# Patient Record
Sex: Male | Born: 1975 | Race: Black or African American | Hispanic: No | Marital: Single | Smoking: Current every day smoker
Health system: Southern US, Community
[De-identification: ages and names within clinical notes are randomized; demographics above are authoritative.]

## PROBLEM LIST (undated history)

## (undated) DIAGNOSIS — K219 Gastro-esophageal reflux disease without esophagitis: Secondary | ICD-10-CM

## (undated) DIAGNOSIS — I10 Essential (primary) hypertension: Secondary | ICD-10-CM

## (undated) HISTORY — DX: Gastro-esophageal reflux disease without esophagitis: K21.9

## (undated) HISTORY — DX: Essential (primary) hypertension: I10

## (undated) HISTORY — PX: NO PAST SURGERIES: SHX2092

---

## 2005-04-16 ENCOUNTER — Emergency Department: Payer: Self-pay | Admitting: Emergency Medicine

## 2006-12-29 ENCOUNTER — Ambulatory Visit: Payer: Self-pay | Admitting: Internal Medicine

## 2007-06-04 ENCOUNTER — Emergency Department: Payer: Self-pay | Admitting: Emergency Medicine
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
## 2012-03-05 ENCOUNTER — Ambulatory Visit: Payer: Self-pay | Admitting: Emergency Medicine

## 2012-03-05 LAB — URINALYSIS, COMPLETE
Glucose,UR: NEGATIVE mg/dL (ref 0–75)
Ketone: NEGATIVE
Ph: 7.5 (ref 4.5–8.0)
Protein: NEGATIVE
Squamous Epithelial: NONE SEEN

## 2012-03-05 LAB — OCCULT BLOOD X 1 CARD TO LAB, STOOL: Occult Blood, Feces: NEGATIVE

## 2012-03-14 ENCOUNTER — Ambulatory Visit: Payer: Self-pay

## 2012-03-14 LAB — URINALYSIS, COMPLETE
Bilirubin,UR: NEGATIVE
Nitrite: NEGATIVE
Ph: 5 (ref 4.5–8.0)

## 2012-10-09 ENCOUNTER — Ambulatory Visit: Payer: Self-pay | Admitting: Family Medicine

## 2012-10-09 LAB — BASIC METABOLIC PANEL
Anion Gap: 9 (ref 7–16)
BUN: 14 mg/dL (ref 7–18)
Calcium, Total: 9.6 mg/dL (ref 8.5–10.1)
Co2: 30 mmol/L (ref 21–32)
Creatinine: 1.42 mg/dL — ABNORMAL HIGH (ref 0.60–1.30)
EGFR (African American): >60
EGFR (Non-African Amer.): >60
Glucose: 95 mg/dL (ref 65–99)
Potassium: 4.6 mmol/L (ref 3.5–5.1)

## 2013-06-30 ENCOUNTER — Ambulatory Visit: Payer: Self-pay | Admitting: Physician Assistant

## 2013-06-30 LAB — URINALYSIS, COMPLETE
BACTERIA: NEGATIVE
Bilirubin,UR: NEGATIVE
Glucose,UR: NEGATIVE mg/dL (ref 0–75)
Ketone: NEGATIVE
LEUKOCYTE ESTERASE: NEGATIVE
Nitrite: NEGATIVE
Ph: 7 (ref 4.5–8.0)
SPECIFIC GRAVITY: 1.015 (ref 1.003–1.030)
SQUAMOUS EPITHELIAL: NONE SEEN

## 2013-07-02 LAB — URINE CULTURE

## 2013-07-13 ENCOUNTER — Ambulatory Visit: Payer: Self-pay

## 2013-07-13 LAB — CBC WITH DIFFERENTIAL/PLATELET
Basophil #: 0 10*3/uL (ref 0.0–0.1)
Basophil %: 0.5 %
EOS ABS: 0.1 10*3/uL (ref 0.0–0.7)
EOS PCT: 1.3 %
HCT: 50.7 % (ref 40.0–52.0)
HGB: 17.1 g/dL (ref 13.0–18.0)
Lymphocyte #: 2.3 10*3/uL (ref 1.0–3.6)
Lymphocyte %: 38.9 %
MCH: 32 pg (ref 26.0–34.0)
MCHC: 33.8 g/dL (ref 32.0–36.0)
MCV: 95 fL (ref 80–100)
Monocyte #: 0.4 x10 3/mm (ref 0.2–1.0)
Monocyte %: 7.7 %
NEUTROS PCT: 51.6 %
Neutrophil #: 3 10*3/uL (ref 1.4–6.5)
PLATELETS: 247 10*3/uL (ref 150–440)
RBC: 5.36 10*6/uL (ref 4.40–5.90)
RDW: 13 % (ref 11.5–14.5)
WBC: 5.8 10*3/uL (ref 3.8–10.6)

## 2013-07-13 LAB — COMPREHENSIVE METABOLIC PANEL
Albumin: 4.8 g/dL (ref 3.4–5.0)
Alkaline Phosphatase: 110 U/L
Anion Gap: 15 (ref 7–16)
BUN: 16 mg/dL (ref 7–18)
Bilirubin,Total: 1.1 mg/dL — ABNORMAL HIGH (ref 0.2–1.0)
CO2: 23 mmol/L (ref 21–32)
Calcium, Total: 9.9 mg/dL (ref 8.5–10.1)
Chloride: 99 mmol/L (ref 98–107)
Creatinine: 1.42 mg/dL — ABNORMAL HIGH (ref 0.60–1.30)
EGFR (African American): >60
EGFR (Non-African Amer.): >60
GLUCOSE: 98 mg/dL (ref 65–99)
Osmolality: 275 (ref 275–301)
Potassium: 4.3 mmol/L (ref 3.5–5.1)
SGOT(AST): 25 U/L (ref 15–37)
SGPT (ALT): 62 U/L (ref 12–78)
SODIUM: 137 mmol/L (ref 136–145)
TOTAL PROTEIN: 9 g/dL — AB (ref 6.4–8.2)

## 2013-07-13 LAB — URINALYSIS, COMPLETE
BILIRUBIN, UR: NEGATIVE
Bacteria: NEGATIVE
GLUCOSE, UR: NEGATIVE mg/dL (ref 0–75)
LEUKOCYTE ESTERASE: NEGATIVE
Nitrite: NEGATIVE
Ph: 6 (ref 4.5–8.0)
Protein: NEGATIVE
SPECIFIC GRAVITY: 1.01 (ref 1.003–1.030)
SQUAMOUS EPITHELIAL: NONE SEEN
WBC UR: NONE SEEN /HPF (ref 0–5)

## 2013-07-13 LAB — CK TOTAL AND CKMB (NOT AT ARMC)
CK, Total: 150 U/L
CK-MB: 0.8 ng/mL (ref 0.5–3.6)

## 2013-07-13 LAB — D-DIMER(ARMC): D-Dimer: 232 ng/ml

## 2014-06-09 ENCOUNTER — Encounter: Payer: Self-pay | Admitting: Internal Medicine

## 2014-06-09 DIAGNOSIS — F419 Anxiety disorder, unspecified: Secondary | ICD-10-CM | POA: Insufficient documentation

## 2014-06-09 DIAGNOSIS — E782 Mixed hyperlipidemia: Secondary | ICD-10-CM | POA: Insufficient documentation

## 2014-06-09 DIAGNOSIS — K219 Gastro-esophageal reflux disease without esophagitis: Secondary | ICD-10-CM | POA: Insufficient documentation

## 2014-06-09 DIAGNOSIS — I10 Essential (primary) hypertension: Secondary | ICD-10-CM | POA: Insufficient documentation

## 2014-07-30 ENCOUNTER — Other Ambulatory Visit: Payer: Self-pay | Admitting: Internal Medicine

## 2014-08-26 ENCOUNTER — Ambulatory Visit (INDEPENDENT_AMBULATORY_CARE_PROVIDER_SITE_OTHER): Payer: 59 | Admitting: Internal Medicine

## 2014-08-26 ENCOUNTER — Other Ambulatory Visit: Payer: Self-pay | Admitting: Internal Medicine

## 2014-08-26 ENCOUNTER — Encounter: Payer: Self-pay | Admitting: Internal Medicine

## 2014-08-26 VITALS — BP 136/90 | HR 68 | Ht 73.0 in | Wt 236.6 lb

## 2014-08-26 DIAGNOSIS — I10 Essential (primary) hypertension: Secondary | ICD-10-CM

## 2014-08-26 DIAGNOSIS — K2100 Gastro-esophageal reflux disease with esophagitis, without bleeding: Secondary | ICD-10-CM

## 2014-08-26 DIAGNOSIS — R361 Hematospermia: Secondary | ICD-10-CM | POA: Diagnosis not present

## 2014-08-26 MED ORDER — DOXYCYCLINE HYCLATE 100 MG PO TABS: 100.0000 mg | ORAL_TABLET | Freq: Two times a day (BID) | ORAL | Status: DC

## 2014-08-26 NOTE — Progress Notes (Signed)
Date:  08/26/2014   Name:  Roger Lara   DOB:  [birth year]   MRN:  [id number]   Chief Complaint: Karie Fetch Reflux Gastrophageal Reflux He complains of abdominal pain and heartburn. He reports no chest pain, no choking, no coughing, no dysphagia, no sore throat or no wheezing. The problem occurs frequently. The problem has been unchanged. The heartburn is located in the substernum. The heartburn does not wake him from sleep. The symptoms are aggravated by certain foods, stress, ETOH and smoking (his girlfriend recently had a second tubal pregnancy so can no longer conceive.). Pertinent negatives include no anemia, melena or weight loss. He has tried a PPI (taking omeprazole daily) for the symptoms. The treatment provided moderate relief.  Hypertension This is a chronic problem. The problem has been gradually improving since onset. The problem is controlled (he had cut back the dose due to some ED but resumed 20 mg when he developed mild headache). Associated symptoms include headaches. Pertinent negatives include no chest pain, peripheral edema or shortness of breath. There are no associated agents to hypertension. Past treatments include ACE inhibitors. The current treatment provides significant improvement. There are no compliance problems.    Blood in ejaculate Patient has noticed blood in his ejaculate on several occasions - interspersed with no blood.  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  He denies pain with intercourse or bowel movement.  No fever, dysuria or pelvic pain.  No penile discharge.  Review of Systems:  Review of Systems  Constitutional: Negative for fever, weight loss and unexpected weight change.  HENT: Negative for sinus pressure and sore throat.   Respiratory: Negative for cough, choking, shortness of breath and wheezing.   Cardiovascular: Negative for chest pain and leg  swelling.  Gastrointestinal: Positive for heartburn and abdominal pain. Negative for dysphagia, constipation, blood in stool and melena.  Genitourinary: Negative for hematuria.       Blood in ejaculate  Musculoskeletal: Negative for back pain and gait problem.  Neurological: Positive for headaches.    Patient Active Problem List   Diagnosis Date Noted  . Anxiety 06/09/2014  . Essential (primary) hypertension 06/09/2014  . Acid reflux 06/09/2014  . Mixed hyperlipidemia 06/09/2014    Prior to Admission medications   Medication Sig Start Date End Date Taking? Authorizing Provider  lisinopril (PRINIVIL,ZESTRIL) 20 MG tablet Take 1 tablet by mouth daily. 08/23/14  Yes Historical Provider, MD  omeprazole (PRILOSEC) 40 MG capsule TAKE ONE CAPSULE BY MOUTH ONCE DAILY 07/30/14  Yes Reubin Milan, MD    Allergies  Allergen Reactions  . Serotonin Reuptake Inhibitors (Ssris)     Other reaction(s): drowsiness dry mouth, anorexia  . Sulfa Antibiotics     muscle pain    No past surgical history on file.  History  Substance Use Topics  . Smoking status: Current Some Day Smoker  . Smokeless tobacco: Not on file     Comment: patient not ready to quit  . Alcohol Use: 0.0 oz/week    0 Standard drinks or equivalent per week     Medication list has been reviewed and updated.  Physical Examination:  Physical Exam  Constitutional: He is oriented to person, place, and time. He appears well-developed and well-nourished. No distress.  Neck: Normal range of motion. Neck supple. No thyromegaly present.  Cardiovascular: Normal rate, regular rhythm and normal heart sounds.   Pulmonary/Chest: Effort  normal and breath sounds normal. No respiratory distress. He has no wheezes.  Abdominal: Soft. Bowel sounds are normal. He exhibits no distension and no mass. There is no tenderness. There is no guarding.  Musculoskeletal: Normal range of motion. He exhibits no edema.  Lymphadenopathy:    He has no  cervical adenopathy.  Neurological: He is alert and oriented to person, place, and time.  Psychiatric: He has a normal mood and affect. His speech is normal. Cognition and memory are normal.  Sad and tearful relating recent miscarriage and news that his girl friend can not have children.    BP 148/98 mmHg  Pulse 68  Ht 6\' 1"  (1.854 m)  Wt 236 lb 9.6 oz (107.321 kg)  BMI 31.22 kg/m2  Assessment and Plan: 1. Gastroesophageal reflux disease with esophagitis Continue omeprazole; symptoms worsened by recent stressors Reduce alcohol intake and fast food Return if symptoms fail to improve  2. Essential (primary) hypertension Fair control - continue current therapy of lisinopril 20 mg  3. Hematospermia Suspect Prostatitis; recommend seat cushion to reduce pressure Follow up or call for referral if persistent - doxycycline (VIBRA-TABS) 100 MG tablet; Take 1 tablet (100 mg total) by mouth 2 (two) times daily.  Dispense: 20 tablet; Refill: 0   Bari EdwardLaura Berglund, MD [facility]  08/26/2014

## 2014-09-02 ENCOUNTER — Other Ambulatory Visit: Payer: Self-pay | Admitting: Internal Medicine

## 2014-12-17 ENCOUNTER — Other Ambulatory Visit: Payer: Self-pay | Admitting: Internal Medicine

## 2015-03-24 ENCOUNTER — Other Ambulatory Visit: Payer: Self-pay | Admitting: Internal Medicine

## 2015-03-29 ENCOUNTER — Ambulatory Visit (INDEPENDENT_AMBULATORY_CARE_PROVIDER_SITE_OTHER): Payer: 59 | Admitting: Internal Medicine

## 2015-03-29 ENCOUNTER — Encounter: Payer: Self-pay | Admitting: Internal Medicine

## 2015-03-29 VITALS — BP 128/84 | HR 88 | Ht 73.0 in | Wt 260.0 lb

## 2015-03-29 DIAGNOSIS — N342 Other urethritis: Secondary | ICD-10-CM

## 2015-03-29 MED ORDER — FLUCONAZOLE 100 MG PO TABS: 100.0000 mg | ORAL_TABLET | Freq: Every day | ORAL | Status: DC

## 2015-03-29 NOTE — Progress Notes (Signed)
    Date:  03/29/2015   Name:  Roger Lara   DOB:  1975-11-21   MRN:  [id number]   Chief Complaint: Penile Discharge  Patient complains of about 1 week of urethral itching and mild discomfort with urination. He denies any discharge or exposure to STDs. He's had similar symptoms in the past after antibiotics and was treated for a yeast infection. He had a full course of antibiotics several weeks ago for dental infection and thinks this is related. Denies fever or chills, blood in his urine, low back pain, or lymphadenopathy.   Review of Systems  Constitutional: Negative for fever, chills and fatigue.  Genitourinary: Positive for dysuria. Negative for frequency, hematuria, discharge and genital sores.    Patient Active Problem List   Diagnosis Date Noted  . Anxiety 06/09/2014  . Essential (primary) hypertension 06/09/2014  . Acid reflux 06/09/2014  . Mixed hyperlipidemia 06/09/2014    Prior to Admission medications   Medication Sig Start Date End Date Taking? Authorizing Provider  lisinopril (PRINIVIL,ZESTRIL) 20 MG tablet TAKE ONE TABLET BY MOUTH ONCE DAILY 12/17/14  Yes Reubin Milan, MD  omeprazole (PRILOSEC) 40 MG capsule TAKE ONE CAPSULE BY MOUTH ONCE DAILY 03/24/15  Yes Reubin Milan, MD    Allergies  Allergen Reactions  . Serotonin Reuptake Inhibitors (Ssris)     Other reaction(s): drowsiness dry mouth, anorexia  . Sulfa Antibiotics     muscle pain    Past Surgical History  Procedure Laterality Date  . No past surgeries      Social History  Substance Use Topics  . Smoking status: Current Every Day Smoker -- 0.15 packs/day    Types: Cigarettes  . Smokeless tobacco: None     Comment: patient not ready to quit  . Alcohol Use: Yes     Medication list has been reviewed and updated.   Physical Exam  Constitutional: He appears well-developed and well-nourished.  Abdominal: Soft. Bowel sounds are normal.  Genitourinary: Testes normal and penis normal.  Circumcised. No penile erythema or penile tenderness. No discharge found.  Lymphadenopathy:       Right: No inguinal adenopathy present.       Left: No inguinal adenopathy present.    BP 128/84 mmHg  Pulse 88  Ht  (1.854 m)  Wt 260 lb (117.935 kg)  BMI 34.31 kg/m2  Assessment and Plan: 1. Urethritis Will treat for presume yeast - fluconazole (DIFLUCAN) 100 MG tablet; Take 1 tablet (100 mg total) by mouth daily.  Dispense: 3 tablet; Refill: 0   Bari Edward, MD [facility]  03/29/2015

## 2015-04-06 ENCOUNTER — Encounter: Payer: 59 | Admitting: Internal Medicine

## 2015-04-15 ENCOUNTER — Other Ambulatory Visit: Payer: Self-pay | Admitting: Internal Medicine

## 2015-05-19 ENCOUNTER — Other Ambulatory Visit: Payer: Self-pay | Admitting: Internal Medicine

## 2015-06-10 NOTE — Telephone Encounter (Signed)
Pt scheduled July 27 @ 830 for cpe

## 2015-07-05 ENCOUNTER — Other Ambulatory Visit: Payer: Self-pay | Admitting: Internal Medicine

## 2015-09-16 ENCOUNTER — Other Ambulatory Visit: Payer: Self-pay | Admitting: Internal Medicine

## 2015-09-16 ENCOUNTER — Encounter: Payer: Self-pay | Admitting: Internal Medicine

## 2015-09-16 ENCOUNTER — Encounter: Payer: 59 | Admitting: Internal Medicine

## 2015-09-21 ENCOUNTER — Encounter: Payer: Self-pay | Admitting: Family Medicine

## 2015-09-21 ENCOUNTER — Ambulatory Visit (INDEPENDENT_AMBULATORY_CARE_PROVIDER_SITE_OTHER): Payer: 59 | Admitting: Family Medicine

## 2015-09-21 VITALS — BP 130/70 | HR 100 | Temp 97.9°F | Ht 73.0 in | Wt 261.0 lb

## 2015-09-21 DIAGNOSIS — N410 Acute prostatitis: Secondary | ICD-10-CM | POA: Diagnosis not present

## 2015-09-21 MED ORDER — DOXYCYCLINE HYCLATE 100 MG PO TABS: 100.0000 mg | ORAL_TABLET | Freq: Two times a day (BID) | ORAL | 1 refills | Status: DC

## 2015-09-21 NOTE — Progress Notes (Signed)
Name: Roger Lara   MRN: [id number]    DOB: May 31, 1975   Date:09/21/2015       Progress Note  Subjective  Chief Complaint  Chief Complaint  Patient presents with  . Urinary Tract Infection    has noticed some blood after sex- no problems with urination- was treated for urethritis back in Feb.    Urinary Tract Infection   This is a recurrent (hemospermia) problem. The current episode started in the past 7 days. The problem occurs intermittently. The problem has been gradually improving. The quality of the pain is described as aching. The pain is at a severity of 4/10. The pain is mild. There has been no fever. Pertinent negatives include no chills, discharge, flank pain, frequency, hematuria, hesitancy, nausea, sweats, urgency or vomiting. He has tried acetaminophen for the symptoms. The treatment provided mild relief.    No problem-specific Assessment & Plan notes found for this encounter.   Past Medical History:  Diagnosis Date  . GERD (gastroesophageal reflux disease)   . Hypertension     Past Surgical History:  Procedure Laterality Date  . NO PAST SURGERIES      Family History  Problem Relation Age of Onset  . Aneurysm Father     2009, Brain  . Hypertension Mother   . Depression Mother     Social History   Social History  . Marital status: Single    Spouse name: N/A  . Number of children: N/A  . Years of education: N/A   Occupational History  . Not on file.   Social History Main Topics  . Smoking status: Current Every Day Smoker    Packs/day: 0.15    Types: Cigarettes  . Smokeless tobacco: Never Used     Comment: patient not ready to quit  . Alcohol use Yes  . Drug use:      Comment: marijuana occasional   . Sexual activity: Not on file   Other Topics Concern  . Not on file   Social History Narrative  . No narrative on file    Allergies  Allergen Reactions  . Serotonin Reuptake Inhibitors (Ssris)     Other reaction(s): drowsiness dry mouth,  anorexia  . Sulfa Antibiotics     muscle pain     Review of Systems  Constitutional: Negative for chills, fever, malaise/fatigue and weight loss.  HENT: Negative for ear discharge, ear pain and sore throat.   Eyes: Negative for blurred vision.  Respiratory: Negative for cough, sputum production, shortness of breath and wheezing.   Cardiovascular: Negative for chest pain, palpitations and leg swelling.  Gastrointestinal: Negative for abdominal pain, blood in stool, constipation, diarrhea, heartburn, melena, nausea and vomiting.  Genitourinary: Negative for dysuria, flank pain, frequency, hematuria, hesitancy and urgency.  Musculoskeletal: Negative for back pain, joint pain, myalgias and neck pain.  Skin: Negative for rash.  Neurological: Negative for dizziness, tingling, sensory change, focal weakness and headaches.  Endo/Heme/Allergies: Negative for environmental allergies and polydipsia. Does not bruise/bleed easily.  Psychiatric/Behavioral: Negative for depression and suicidal ideas. The patient is not nervous/anxious and does not have insomnia.      Objective  Vitals:   09/21/15 1347  BP: 130/70  Pulse: 100  Temp: 97.9 F (36.6 C)  TempSrc: Oral  Weight: 261 lb (118.4 kg)  Height: 6\' 1"  (1.854 m)    Physical Exam  Constitutional: He is oriented to person, place, and time and well-developed, well-nourished, and in no distress.  HENT:  Head: Normocephalic.  Right Ear: External ear normal.  Left Ear: External ear normal.  Nose: Nose normal.  Mouth/Throat: Oropharynx is clear and moist.  Eyes: Conjunctivae and EOM are normal. Pupils are equal, round, and reactive to light. Right eye exhibits no discharge. Left eye exhibits no discharge. No scleral icterus.  Neck: Normal range of motion. Neck supple. No JVD present. No tracheal deviation present. No thyromegaly present.  Cardiovascular: Normal rate, regular rhythm, normal heart sounds and intact distal pulses.  Exam reveals  no gallop and no friction rub.   No murmur heard. Pulmonary/Chest: Breath sounds normal. No respiratory distress. He has no wheezes. He has no rales.  Abdominal: Soft. Bowel sounds are normal. He exhibits no mass. There is no hepatosplenomegaly. There is no tenderness. There is no rebound, no guarding and no CVA tenderness.  Genitourinary: Rectum normal. Prostate is enlarged and tender. No discharge found.  Musculoskeletal: Normal range of motion. He exhibits no edema or tenderness.  Lymphadenopathy:    He has no cervical adenopathy.  Neurological: He is alert and oriented to person, place, and time. He has normal sensation, normal strength, normal reflexes and intact cranial nerves. No cranial nerve deficit.  Skin: Skin is warm. No rash noted.  Psychiatric: Mood and affect normal.      Assessment & Plan  Problem List Items Addressed This Visit    None    Visit Diagnoses    Acute prostatitis    -  Primary   Relevant Medications   doxycycline (VIBRA-TABS) 100 MG tablet    pt couldn't void- no urine sample collected    Dr. Hayden [facility]  09/21/15

## 2015-10-13 ENCOUNTER — Other Ambulatory Visit: Payer: Self-pay | Admitting: Internal Medicine

## 2015-10-16 ENCOUNTER — Other Ambulatory Visit: Payer: Self-pay | Admitting: Internal Medicine

## 2015-10-19 NOTE — Telephone Encounter (Signed)
pts coming in on 9/1 °

## 2015-10-22 ENCOUNTER — Ambulatory Visit: Payer: 59 | Admitting: Internal Medicine

## 2015-11-26 ENCOUNTER — Other Ambulatory Visit: Payer: Self-pay | Admitting: Internal Medicine

## 2016-05-03 ENCOUNTER — Other Ambulatory Visit: Payer: Self-pay

## 2018-04-11 ENCOUNTER — Other Ambulatory Visit: Payer: Self-pay | Admitting: Physician Assistant

## 2018-04-20 ENCOUNTER — Other Ambulatory Visit: Payer: Self-pay | Admitting: Physician Assistant

## 2020-03-10 ENCOUNTER — Ambulatory Visit
Admission: RE | Admit: 2020-03-10 | Discharge: 2020-03-10 | Disposition: A | Discharge status: Home / Self Care | Payer: 59 | Attending: Family Medicine | Admitting: Family Medicine

## 2020-03-10 ENCOUNTER — Ambulatory Visit
Admission: RE | Admit: 2020-03-10 | Discharge: 2020-03-10 | Disposition: A | Discharge status: Home / Self Care | Payer: 59 | Source: Ambulatory Visit | Attending: Family Medicine | Admitting: Family Medicine

## 2020-03-10 ENCOUNTER — Other Ambulatory Visit: Payer: Self-pay | Admitting: Family Medicine

## 2020-03-10 ENCOUNTER — Other Ambulatory Visit: Payer: Self-pay

## 2020-03-10 DIAGNOSIS — R0600 Dyspnea, unspecified: Secondary | ICD-10-CM | POA: Diagnosis present

## 2020-03-10 DIAGNOSIS — R0609 Other forms of dyspnea: Secondary | ICD-10-CM

## 2021-06-16 IMAGING — CR DG CHEST 2V
2 series · 2 of 2 positions shown · non-contrast
Comparison: 07/13/2013

CLINICAL DATA: Dyspnea on exertion

EXAM:
CHEST - 2 VIEW

[chest pa]
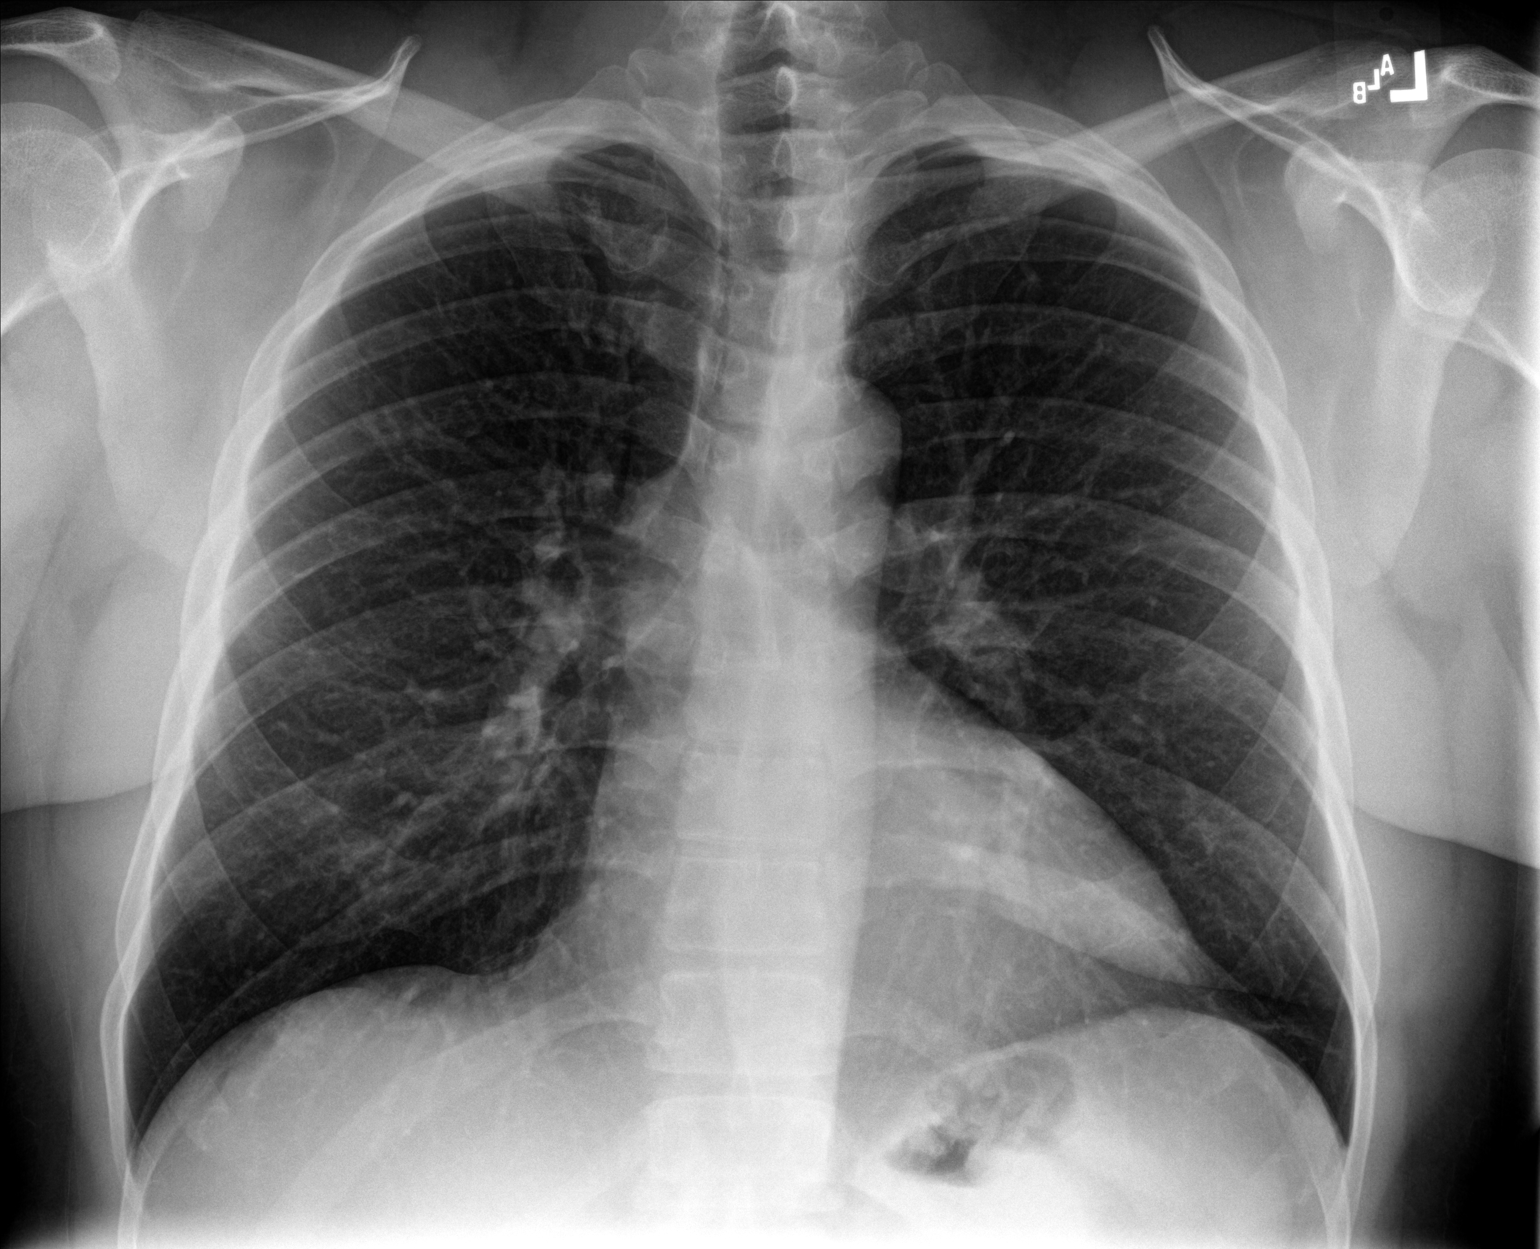

[chest lat]
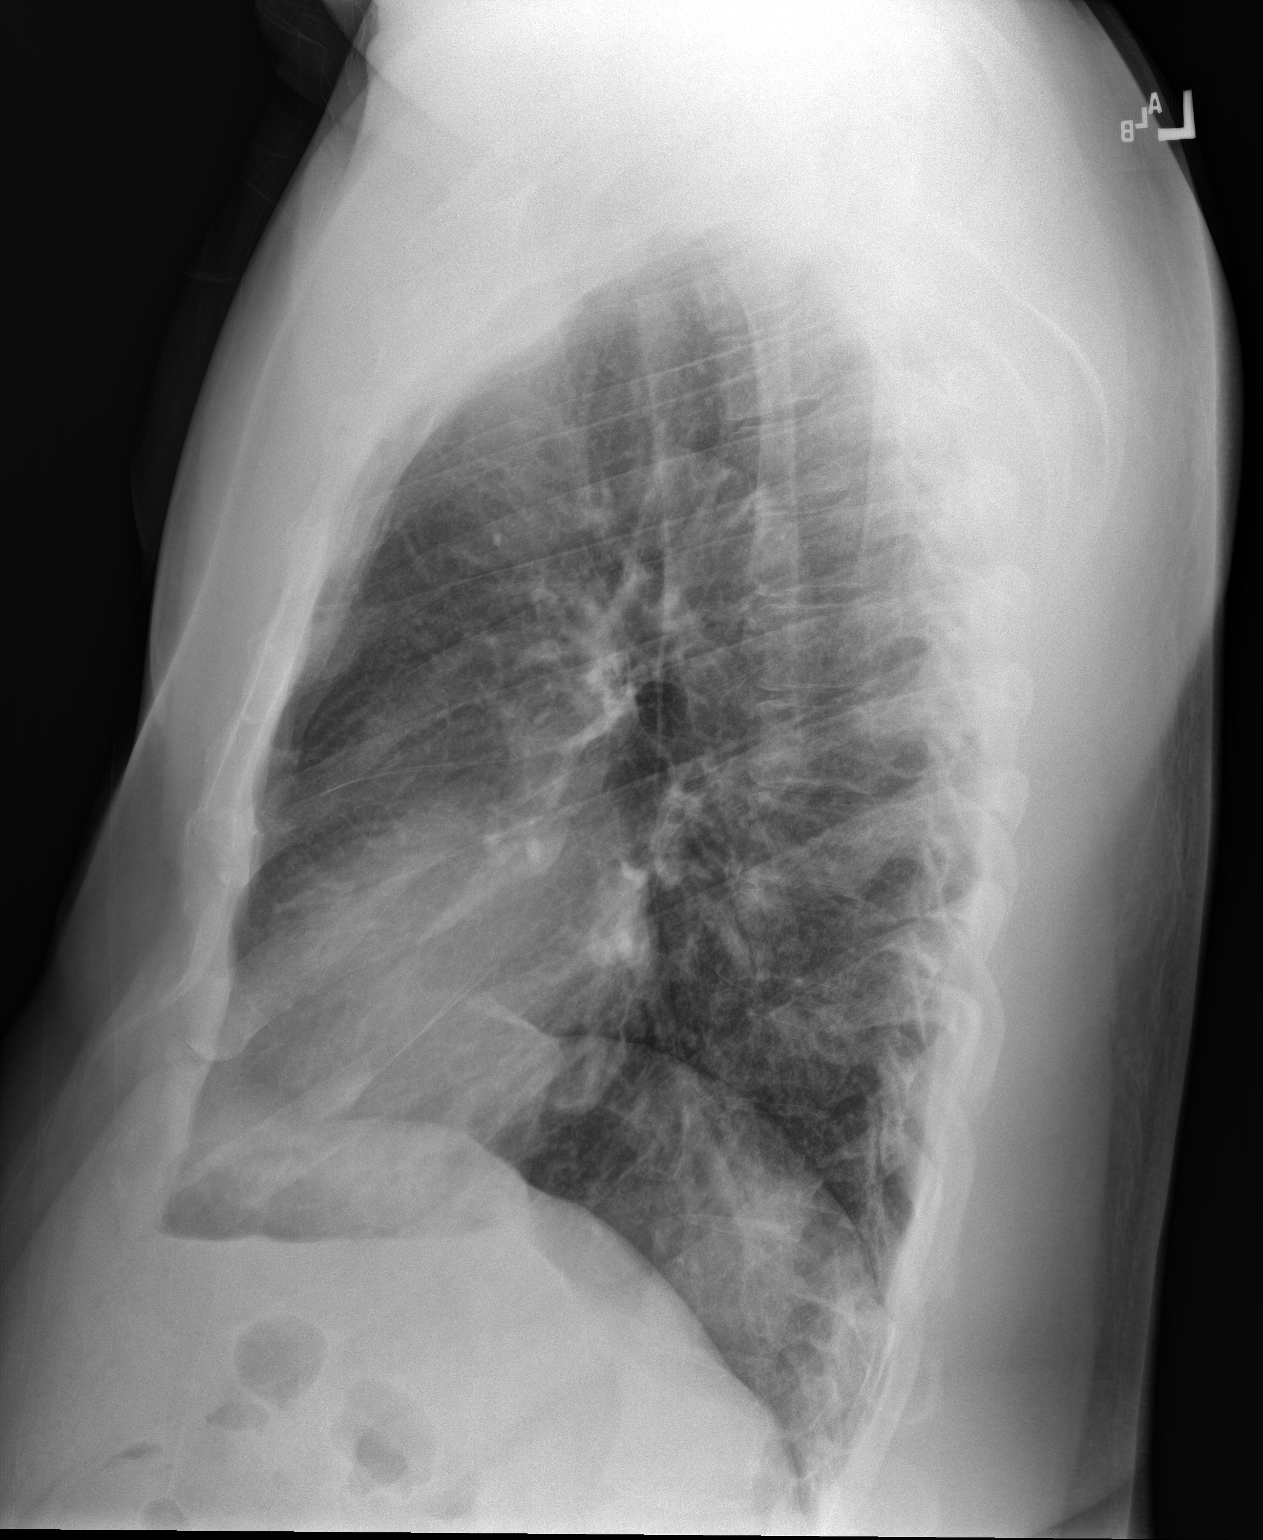

[2 of 2 positions shown; findings below may reference images not displayed]

FINDINGS: The heart size and mediastinal contours are within normal limits.
Both lungs are clear. The visualized skeletal structures are
unremarkable.
IMPRESSION: No active cardiopulmonary disease.
